# Patient Record
Sex: Male | Born: 1945 | Race: Black or African American | Hispanic: No | Marital: Married | Smoking: Never smoker
Health system: Southern US, Community
[De-identification: ages and names within clinical notes are randomized; demographics above are authoritative.]

## PROBLEM LIST (undated history)

## (undated) DIAGNOSIS — I10 Essential (primary) hypertension: Secondary | ICD-10-CM

## (undated) DIAGNOSIS — K219 Gastro-esophageal reflux disease without esophagitis: Secondary | ICD-10-CM

## (undated) DIAGNOSIS — I200 Unstable angina: Secondary | ICD-10-CM

## (undated) HISTORY — PX: HERNIA REPAIR: SHX51

## (undated) HISTORY — PX: TOTAL HIP ARTHROPLASTY: SHX124

---

## 2011-02-06 ENCOUNTER — Encounter: Payer: Self-pay | Admitting: Emergency Medicine

## 2011-02-06 ENCOUNTER — Emergency Department (HOSPITAL_COMMUNITY)

## 2011-02-06 ENCOUNTER — Other Ambulatory Visit: Payer: Self-pay

## 2011-02-06 ENCOUNTER — Inpatient Hospital Stay (HOSPITAL_COMMUNITY)
Admission: EM | Admit: 2011-02-06 | Discharge: 2011-02-09 | DRG: 287 | Disposition: A | Discharge status: Home / Self Care | Source: Ambulatory Visit | Attending: Cardiology | Admitting: Cardiology

## 2011-02-06 DIAGNOSIS — I2510 Atherosclerotic heart disease of native coronary artery without angina pectoris: Principal | ICD-10-CM | POA: Diagnosis present

## 2011-02-06 DIAGNOSIS — I200 Unstable angina: Secondary | ICD-10-CM

## 2011-02-06 DIAGNOSIS — I10 Essential (primary) hypertension: Secondary | ICD-10-CM | POA: Insufficient documentation

## 2011-02-06 DIAGNOSIS — Z8249 Family history of ischemic heart disease and other diseases of the circulatory system: Secondary | ICD-10-CM

## 2011-02-06 DIAGNOSIS — Z8711 Personal history of peptic ulcer disease: Secondary | ICD-10-CM

## 2011-02-06 DIAGNOSIS — R079 Chest pain, unspecified: Secondary | ICD-10-CM

## 2011-02-06 DIAGNOSIS — K219 Gastro-esophageal reflux disease without esophagitis: Secondary | ICD-10-CM

## 2011-02-06 DIAGNOSIS — Z96649 Presence of unspecified artificial hip joint: Secondary | ICD-10-CM

## 2011-02-06 DIAGNOSIS — Z7982 Long term (current) use of aspirin: Secondary | ICD-10-CM

## 2011-02-06 DIAGNOSIS — I2584 Coronary atherosclerosis due to calcified coronary lesion: Secondary | ICD-10-CM | POA: Diagnosis present

## 2011-02-06 HISTORY — DX: Unstable angina: I20.0

## 2011-02-06 HISTORY — DX: Gastro-esophageal reflux disease without esophagitis: K21.9

## 2011-02-06 HISTORY — DX: Essential (primary) hypertension: I10

## 2011-02-06 LAB — COMPREHENSIVE METABOLIC PANEL
AST: 24 U/L (ref 0–37)
Albumin: 3.7 g/dL (ref 3.5–5.2)
Alkaline Phosphatase: 50 U/L (ref 39–117)
Chloride: 101 mEq/L (ref 96–112)
Potassium: 3.6 mEq/L (ref 3.5–5.1)
Sodium: 137 mEq/L (ref 135–145)
Total Bilirubin: 0.6 mg/dL (ref 0.3–1.2)

## 2011-02-06 LAB — CBC
Platelets: 187 10*3/uL (ref 150–400)
RBC: 5.46 MIL/uL (ref 4.22–5.81)
WBC: 7.4 10*3/uL (ref 4.0–10.5)

## 2011-02-06 LAB — TROPONIN I: Troponin I: <0.3 ng/mL (ref <0.30)

## 2011-02-06 MED ORDER — ASPIRIN 81 MG PO CHEW
CHEWABLE_TABLET | ORAL | Status: AC
  Administered 2011-02-06: 325 mg
  Filled 2011-02-06: qty 4

## 2011-02-06 MED ORDER — ASPIRIN 325 MG PO TABS: 325.0000 mg | ORAL_TABLET | Freq: Once | ORAL | Status: DC

## 2011-02-06 MED ORDER — ENOXAPARIN SODIUM 100 MG/ML ~~LOC~~ SOLN
85.0000 mg | SUBCUTANEOUS | Status: AC
  Administered 2011-02-06: 85 mg via SUBCUTANEOUS
  Filled 2011-02-06: qty 1

## 2011-02-06 NOTE — ED Notes (Signed)
Pt states pain has decreased to 1/10. Denies sob. Wife at bedside. Awaiting lab results

## 2011-02-06 NOTE — ED Provider Notes (Signed)
History     CSN: [id number] Arrival date & time: 02/06/2011  7:15 PM   First MD Initiated Contact with Patient 02/06/11 2055      Chief Complaint  Patient presents with  . Chest Pain    Patient is a 65 y.o. male presenting with chest pain. The history is provided by the patient.  Chest Pain    Patient reports 3 days of left-sided chest pain with associated nausea.  The pain is located under the left breast that is sharp in nature.  He reports his discomfort is intermittent.  It is worsened by exertion.  It is improved by rest.  He's never had these symptoms before.  He does have a history of GI ulcers and heartburn for which he reports this is different.  His wife convinced him to come to the emergency department today.  Currently he is chest pain-free.  He has no history of hypertension or hyperlipidemia.  He is not a smoker.  He has no history of diabetes.  He has had 2 brothers both with heart by passes.  No recent long travel.  Denies unilateral leg swelling.  No history of DVT or pulmonary embolus   History reviewed. No pertinent past medical history.  Past Surgical History  Procedure Date  . Total hip arthroplasty   . Hernia repair     History reviewed. No pertinent family history.  History  Substance Use Topics  . Smoking status: Never Smoker   . Smokeless tobacco: Not on file  . Alcohol Use: Yes      Review of Systems  Cardiovascular: Positive for chest pain.  All other systems reviewed and are negative.    Allergies  Review of patient's allergies indicates no known allergies.  Home Medications   Current Outpatient Rx  Name Route Sig Dispense Refill  . OMEPRAZOLE 20 MG PO CPDR Oral Take 20 mg by mouth daily.        BP 166/92  Pulse 60  Temp(Src) 98.4 F (36.9 C) (Oral)  Resp 18  Wt 188 lb (85.276 kg)  SpO2 100%  Physical Exam  Nursing note and vitals reviewed. Constitutional: He is oriented to person, place, and time. He appears well-developed  and well-nourished.  HENT:  Head: Normocephalic and atraumatic.  Eyes: EOM are normal.  Neck: Normal range of motion.  Cardiovascular: Normal rate, regular rhythm, normal heart sounds and intact distal pulses.   Pulmonary/Chest: Effort normal and breath sounds normal. No respiratory distress.  Abdominal: Soft. He exhibits no distension. There is no tenderness.  Musculoskeletal: Normal range of motion.  Neurological: He is alert and oriented to person, place, and time.  Skin: Skin is warm and dry.  Psychiatric: He has a normal mood and affect. Judgment normal.    ED Course  Procedures (including critical care time)  Labs Reviewed  COMPREHENSIVE METABOLIC PANEL - Abnormal; Notable for the following:    Glucose, Bld 105 (*)    GFR calc non Af Amer 69 (*)    GFR calc Af Amer 80 (*)    All other components within normal limits  CBC  TROPONIN I  PROTIME-INR   Dg Chest 2 View  02/06/2011  *RADIOLOGY REPORT*  Clinical Data: 2-day history of chest discomfort and pain.  History of GE reflux disease.  CHEST - 2 VIEW 02/06/2011:  Comparison: None.  Findings: Cardiac silhouette normal in size.  Thoracic aorta mildly tortuous.  Hilar and mediastinal contours otherwise unremarkable. Numerous calcified granulomata in both lungs.  Lungs otherwise clear.  No pleural effusions.  Mild degenerative changes involving the thoracic spine.  IMPRESSION: No acute cardiopulmonary disease.  Multiple calcified granulomata throughout both lungs.  Original Report Authenticated By: Arnell Sieving, M.D.     1. Chest pain       MDM  Story concerning for unstable angina with exertional chest pain and a significant family history of coronary artery disease.  EKG and troponin are normal here I spoke with the [facility] cardiologist who will evaluate the pt in the ER. ASA and lovenox. CP free now          Lyanne Co, MD 02/06/11 2350

## 2011-02-06 NOTE — ED Notes (Signed)
Pt denies pain at this time. Medicated as listed. Awaiting md recheck for dispostion

## 2011-02-06 NOTE — ED Notes (Signed)
Pt reports onset yesterday with left sided chest pain and nausea. Pt talking in complete sentences without difficulty.

## 2011-02-07 ENCOUNTER — Encounter (HOSPITAL_COMMUNITY): Payer: Self-pay | Admitting: Cardiology

## 2011-02-07 DIAGNOSIS — R079 Chest pain, unspecified: Secondary | ICD-10-CM

## 2011-02-07 DIAGNOSIS — I200 Unstable angina: Secondary | ICD-10-CM

## 2011-02-07 DIAGNOSIS — K219 Gastro-esophageal reflux disease without esophagitis: Secondary | ICD-10-CM

## 2011-02-07 LAB — PROTIME-INR
INR: 1.14 (ref 0.00–1.49)
Prothrombin Time: 14.8 seconds (ref 11.6–15.2)

## 2011-02-07 LAB — LIPID PANEL
LDL Cholesterol: 139 mg/dL — ABNORMAL HIGH (ref 0–99)
Total CHOL/HDL Ratio: 4.3 RATIO
VLDL: 14 mg/dL (ref 0–40)

## 2011-02-07 LAB — CBC
Hemoglobin: 15 g/dL (ref 13.0–17.0)
Platelets: 182 10*3/uL (ref 150–400)
RBC: 5.54 MIL/uL (ref 4.22–5.81)

## 2011-02-07 LAB — BASIC METABOLIC PANEL
CO2: 26 mEq/L (ref 19–32)
Calcium: 9.1 mg/dL (ref 8.4–10.5)
GFR calc non Af Amer: 75 mL/min — ABNORMAL LOW (ref >90)
Glucose, Bld: 104 mg/dL — ABNORMAL HIGH (ref 70–99)
Potassium: 3.7 mEq/L (ref 3.5–5.1)
Sodium: 137 mEq/L (ref 135–145)

## 2011-02-07 LAB — HEPARIN LEVEL (UNFRACTIONATED): Heparin Unfractionated: 0.55 IU/mL (ref 0.30–0.70)

## 2011-02-07 MED ORDER — NITROGLYCERIN 0.4 MG SL SUBL: 0.4000 mg | SUBLINGUAL_TABLET | SUBLINGUAL | Status: DC | PRN

## 2011-02-07 MED ORDER — ASPIRIN EC 81 MG PO TBEC: 81.0000 mg | DELAYED_RELEASE_TABLET | Freq: Every day | ORAL | Status: DC

## 2011-02-07 MED ORDER — PANTOPRAZOLE SODIUM 40 MG PO TBEC
40.0000 mg | DELAYED_RELEASE_TABLET | Freq: Every day | ORAL | Status: DC
  Administered 2011-02-07 – 2011-02-08 (×2): 40 mg via ORAL
  Filled 2011-02-07 (×2): qty 1

## 2011-02-07 MED ORDER — SODIUM CHLORIDE 0.9 % IV SOLN: 250.0000 mL | INTRAVENOUS | Status: DC | PRN

## 2011-02-07 MED ORDER — HEPARIN SOD (PORCINE) IN D5W 100 UNIT/ML IV SOLN
1100.0000 [IU]/h | INTRAVENOUS | Status: DC
  Administered 2011-02-07: 1100 [IU]/h via INTRAVENOUS
  Filled 2011-02-07: qty 250

## 2011-02-07 MED ORDER — METOPROLOL TARTRATE 12.5 MG HALF TABLET
12.5000 mg | ORAL_TABLET | Freq: Two times a day (BID) | ORAL | Status: DC
  Administered 2011-02-07 – 2011-02-09 (×2): 12.5 mg via ORAL
  Filled 2011-02-07 (×6): qty 1

## 2011-02-07 MED ORDER — ROSUVASTATIN CALCIUM 20 MG PO TABS
20.0000 mg | ORAL_TABLET | Freq: Every day | ORAL | Status: DC
  Administered 2011-02-07 – 2011-02-08 (×2): 20 mg via ORAL
  Filled 2011-02-07 (×3): qty 1

## 2011-02-07 MED ORDER — SODIUM CHLORIDE 0.9 % IJ SOLN: 3.0000 mL | INTRAMUSCULAR | Status: DC | PRN

## 2011-02-07 MED ORDER — AMLODIPINE BESYLATE 5 MG PO TABS
5.0000 mg | ORAL_TABLET | Freq: Every day | ORAL | Status: DC
  Administered 2011-02-07 – 2011-02-09 (×3): 5 mg via ORAL
  Filled 2011-02-07 (×3): qty 1

## 2011-02-07 MED ORDER — DIAZEPAM 5 MG PO TABS
5.0000 mg | ORAL_TABLET | ORAL | Status: AC
  Administered 2011-02-08: 5 mg via ORAL
  Filled 2011-02-07: qty 1

## 2011-02-07 MED ORDER — ASPIRIN 300 MG RE SUPP
300.0000 mg | RECTAL | Status: AC
  Filled 2011-02-07: qty 1

## 2011-02-07 MED ORDER — SODIUM CHLORIDE 0.9 % IJ SOLN
3.0000 mL | Freq: Two times a day (BID) | INTRAMUSCULAR | Status: DC
  Administered 2011-02-07 – 2011-02-09 (×4): 3 mL via INTRAVENOUS

## 2011-02-07 MED ORDER — ASPIRIN EC 81 MG PO TBEC
81.0000 mg | DELAYED_RELEASE_TABLET | Freq: Every day | ORAL | Status: DC
  Administered 2011-02-09: 81 mg via ORAL
  Filled 2011-02-07: qty 1

## 2011-02-07 MED ORDER — SODIUM CHLORIDE 0.9 % IJ SOLN
3.0000 mL | Freq: Two times a day (BID) | INTRAMUSCULAR | Status: DC
  Administered 2011-02-08 – 2011-02-09 (×2): 3 mL via INTRAVENOUS

## 2011-02-07 MED ORDER — SODIUM CHLORIDE 0.9 % IV SOLN
1.0000 mL/kg/h | INTRAVENOUS | Status: DC
  Administered 2011-02-08: 1 mL/kg/h via INTRAVENOUS

## 2011-02-07 MED ORDER — ACETAMINOPHEN 325 MG PO TABS: 650.0000 mg | ORAL_TABLET | ORAL | Status: DC | PRN

## 2011-02-07 MED ORDER — HEPARIN BOLUS VIA INFUSION
4000.0000 [IU] | Freq: Once | INTRAVENOUS | Status: AC
  Administered 2011-02-07: 4000 [IU] via INTRAVENOUS
  Filled 2011-02-07: qty 4000

## 2011-02-07 MED ORDER — HEPARIN SOD (PORCINE) IN D5W 100 UNIT/ML IV SOLN
800.0000 [IU]/h | INTRAVENOUS | Status: DC
  Administered 2011-02-07 – 2011-02-08 (×2): 800 [IU]/h via INTRAVENOUS
  Filled 2011-02-07 (×3): qty 250

## 2011-02-07 MED ORDER — ASPIRIN 81 MG PO CHEW
324.0000 mg | CHEWABLE_TABLET | ORAL | Status: AC
  Administered 2011-02-08: 324 mg via ORAL
  Filled 2011-02-07: qty 4

## 2011-02-07 MED ORDER — ONDANSETRON HCL 4 MG/2ML IJ SOLN: 4.0000 mg | Freq: Four times a day (QID) | INTRAMUSCULAR | Status: DC | PRN

## 2011-02-07 NOTE — Progress Notes (Signed)
ANTICOAGULATION CONSULT NOTE - Initial Consult  Pharmacy Consult for heparin Indication: chest pain/ACS  No Known Allergies  Patient Measurements: Weight: 188 lb (85.276 kg)  Vital Signs: Temp: 97.4 F (36.3 C) (12/08 2344) Temp src: Oral (12/08 2344) BP: 149/92 mmHg (12/09 0041) Pulse Rate: 52  (12/09 0041)  Labs:  [facility] 02/06/11 2207  HGB 15.0  HCT 43.7  PLT 187  APTT --  LABPROT 14.4  INR 1.10  HEPARINUNFRC --  CREATININE 1.11  CKTOTAL --  CKMB --  TROPONINI <0.30   SCr 1.1  Medical History: History reviewed. No pertinent past medical history.  Medications:  Pending  Assessment: 65yo male c/o CP different from GERD pain, initial CE negative, to begin heparin.  Goal of Therapy:  Heparin level 0.3-0.7 units/ml   Plan:  Will give heparin bolus of 4000 units followed by gtt at 1100 units/hr and monitor heparin levels and CBC.  Colleen Can PharmD BCPS 02/07/2011,1:10 AM

## 2011-02-07 NOTE — ED Notes (Signed)
Attempted to call report to 32; RN to call back

## 2011-02-07 NOTE — Progress Notes (Signed)
@   Subjective:  Denies CP or dyspnea   Objective:  Filed Vitals:   02/06/11 2344 02/07/11 0041 02/07/11 0200 02/07/11 0309  BP: 148/91 149/92 163/92 161/94  Pulse: 50 52 46 54  Temp: 97.4 F (36.3 C)  98 F (36.7 C) 98.4 F (36.9 C)  TempSrc: Oral  Oral Oral  Resp: 20 17  18   Height:    5\' 8"  (1.727 m)  Weight:    192 lb 10.9 oz (87.4 kg)  SpO2: 98% 98% 99% 98%    Intake/Output from previous day:  Intake/Output Summary (Last 24 hours) at 02/07/11 1006 Last data filed at 02/07/11 0800  Gross per 24 hour  Intake    240 ml  Output      0 ml  Net    240 ml    Physical Exam: Physical exam: Well-developed well-nourished in no acute distress.  Skin is warm and dry.  HEENT is normal.  Neck is supple. No thyromegaly.  Chest is clear to auscultation with normal expansion.  Cardiovascular exam is regular rate and rhythm.  Abdominal exam nontender or distended. No masses palpated. Extremities show no edema. neuro grossly intact    Lab Results: Basic Metabolic Panel:  [facility] 02/06/11 2207  NA 137  K 3.6  CL 101  CO2 27  GLUCOSE 105*  BUN 11  CREATININE 1.11  CALCIUM 9.2  MG --  PHOS --   Liver Function Tests:  [facility] 02/06/11 2207  AST 24  ALT 15  ALKPHOS 50  BILITOT 0.6  PROT 7.0  ALBUMIN 3.7   CBC:  Basename 02/06/11 2207  WBC 7.4  NEUTROABS --  HGB 15.0  HCT 43.7  MCV 80.0  PLT 187   Cardiac Enzymes:  Basename 02/06/11 2207  CKTOTAL --  CKMB --  CKMBINDEX --  TROPONINI <0.30   Fasting Lipid Panel:  Basename 02/07/11 0550  CHOL 199  HDL 46  LDLCALC 139*  TRIG 69  CHOLHDL 4.3  LDLDIRECT --     Assessment/Plan:  1) UA - Patient with new onset exertional chest pain; plan cath in AM (risks and benefits discussed and patient agrees to proceed). Continue ASA, heparin and lopressor; add norvasc for BP; add statin. 2)GERD   Olga Millers 02/07/2011, 10:06 AM

## 2011-02-07 NOTE — ED Notes (Signed)
Paged MD re: pt's HR has been occasionally dropping into the 30's, but pt is asymptomatic, and pt reports he has a history of his HR running in the 40's

## 2011-02-07 NOTE — Progress Notes (Signed)
ANTICOAGULATION CONSULT NOTE - Follow Up Consult  Pharmacy Consult for Heparin Indication: Chest pain  No Known Allergies  Patient Measurements: Height: 5\' 8"  (172.7 cm) Weight: 192 lb 10.9 oz (87.4 kg) (scale C) IBW/kg (Calculated) : 68.4    Vital Signs: Temp: 97.1 F (36.2 C) (12/09 1309) Temp src: Oral (12/09 1309) BP: 129/78 mmHg (12/09 1309) Pulse Rate: 49  (12/09 1309)  Labs:  Basename 02/07/11 1938 02/07/11 1031 02/06/11 2207  HGB -- 15.0 15.0  HCT -- 44.1 43.7  PLT -- 182 187  APTT -- -- --  LABPROT -- 14.8 14.4  INR -- 1.14 1.10  HEPARINUNFRC 0.55 1.10* --  CREATININE -- 1.03 1.11  CKTOTAL -- -- --  CKMB -- -- --  TROPONINI -- -- <0.30   Estimated Creatinine Clearance: 78.9 ml/min (by C-G formula based on Cr of 1.03).   Medications:  Prescriptions prior to admission  Medication Sig Dispense Refill  . omeprazole (PRILOSEC) 20 MG capsule Take 20 mg by mouth daily.         Scheduled:     . amLODipine  5 mg Oral Daily  . aspirin      . aspirin  324 mg Oral Pre-Cath  . aspirin EC  81 mg Oral Daily  . aspirin  300 mg Rectal NOW  . aspirin  325 mg Oral Once  . diazepam  5 mg Oral On Call  . enoxaparin  85 mg Subcutaneous To Major  . heparin  4,000 Units Intravenous Once  . metoprolol tartrate  12.5 mg Oral BID  . pantoprazole  40 mg Oral Q1200  . rosuvastatin  20 mg Oral q1800  . sodium chloride  3 mL Intravenous Q12H  . sodium chloride  3 mL Intravenous Q12H  . DISCONTD: aspirin EC  81 mg Oral Daily   Infusions:     . sodium chloride    . heparin 800 Units/hr (02/07/11 1230)  . DISCONTD: heparin 1,100 Units/hr (02/07/11 1610)    Assessment: Patient with new onset exertional chest pain.  Scheduled cath in AM.  Goal of Therapy:  Heparin level 0.3-0.7 units/ml   Plan:  1.  Continue heparin at 800 units/hr 2.  F/u 8h confirmatory level  [place] L. Illene Bolus, PharmD, BCPS Clinical Pharmacist Pager: [phone] 02/07/2011 8:20 PM

## 2011-02-07 NOTE — H&P (Addendum)
Lawrence Porter is an 65 y.o. male.   Chief Complaint: Chest pain HPI: 65 y/o male with a PMH of GERD and a family history of 2 brothers with CABG at age 17 presenting with 3 day history of intermittent exertional chest discomfort.  Chest pain is described as pressure 4/10 in severity.  It is worse with exertion and relieved by rest.  He specifically states that this chest pain is different from his GERD pain.  In ED his first set of cardiac markers are negative and he is currently chest pain free.   History reviewed. No pertinent past medical history.  Past Surgical History  Procedure Date  . Total hip arthroplasty   . Hernia repair     History reviewed. No pertinent family history. Social History:  reports that he has never smoked. He does not have any smokeless tobacco history on file. He reports that he drinks alcohol. He reports that he does not use illicit drugs.  Allergies: No Known Allergies  Medications Prior to Admission  Medication Dose Route Frequency Provider Last Rate Last Dose  . aspirin 81 MG chewable tablet        325 mg at 02/06/11 2208  . aspirin tablet 325 mg  325 mg Oral Once Lyanne Co, MD      . enoxaparin (LOVENOX) injection 85 mg  85 mg Subcutaneous To Major Lyanne Co, MD   85 mg at 02/06/11 2302   No current outpatient prescriptions on file as of 02/06/2011.    Results for orders placed during the hospital encounter of 02/06/11 (from the past 48 hour(s))  CBC     Status: Normal   Collection Time   02/06/11 10:07 PM      Component Value Range Comment   WBC 7.4  4.0 - 10.5 (K/uL)    RBC 5.46  4.22 - 5.81 (MIL/uL)    Hemoglobin 15.0  13.0 - 17.0 (g/dL)    HCT 04.5  40.9 - 81.1 (%)    MCV 80.0  78.0 - 100.0 (fL)    MCH 27.5  26.0 - 34.0 (pg)    MCHC 34.3  30.0 - 36.0 (g/dL)    RDW 91.4  78.2 - 95.6 (%)    Platelets 187  150 - 400 (K/uL)   TROPONIN I     Status: Normal   Collection Time   02/06/11 10:07 PM      Component Value Range Comment   Troponin I <0.30  <0.30 (ng/mL)   COMPREHENSIVE METABOLIC PANEL     Status: Abnormal   Collection Time   02/06/11 10:07 PM      Component Value Range Comment   Sodium 137  135 - 145 (mEq/L)    Potassium 3.6  3.5 - 5.1 (mEq/L)    Chloride 101  96 - 112 (mEq/L)    CO2 27  19 - 32 (mEq/L)    Glucose, Bld 105 (*) 70 - 99 (mg/dL)    BUN 11  6 - 23 (mg/dL)    Creatinine, Ser 2.13  0.50 - 1.35 (mg/dL)    Calcium 9.2  8.4 - 10.5 (mg/dL)    Total Protein 7.0  6.0 - 8.3 (g/dL)    Albumin 3.7  3.5 - 5.2 (g/dL)    AST 24  0 - 37 (U/L)    ALT 15  0 - 53 (U/L)    Alkaline Phosphatase 50  39 - 117 (U/L)    Total Bilirubin 0.6  0.3 -  1.2 (mg/dL)    GFR calc non Af Amer 69 (*) >90 (mL/min)    GFR calc Af Amer 80 (*) >90 (mL/min)   PROTIME-INR     Status: Normal   Collection Time   02/06/11 10:07 PM      Component Value Range Comment   Prothrombin Time 14.4  11.6 - 15.2 (seconds)    INR 1.10  0.00 - 1.49     Dg Chest 2 View  02/06/2011  *RADIOLOGY REPORT*  Clinical Data: 2-day history of chest discomfort and pain.  History of GE reflux disease.  CHEST - 2 VIEW 02/06/2011:  Comparison: None.  Findings: Cardiac silhouette normal in size.  Thoracic aorta mildly tortuous.  Hilar and mediastinal contours otherwise unremarkable. Numerous calcified granulomata in both lungs.  Lungs otherwise clear.  No pleural effusions.  Mild degenerative changes involving the thoracic spine.  IMPRESSION: No acute cardiopulmonary disease.  Multiple calcified granulomata throughout both lungs.  Original Report Authenticated By: Arnell Sieving, M.D.    Review of Systems  Constitutional: Negative.   HENT: Negative.   Eyes: Negative.   Respiratory: Negative.   Cardiovascular: Positive for chest pain. Negative for palpitations, orthopnea, claudication, leg swelling and PND.  Gastrointestinal: Positive for heartburn (History of GERD). Negative for nausea, vomiting, abdominal pain, diarrhea, constipation, blood in stool and  melena.  Genitourinary: Negative.   Musculoskeletal: Negative.   Skin: Negative.   Neurological: Negative.   Endo/Heme/Allergies: Negative.   Psychiatric/Behavioral: Negative.     Blood pressure 148/91, pulse 50, temperature 97.4 F (36.3 C), temperature source Oral, resp. rate 20, weight 85.276 kg (188 lb), SpO2 98.00%. Physical Exam  Constitutional: He is oriented to person, place, and time. He appears well-developed. No distress.  HENT:  Head: Normocephalic.  Eyes: Conjunctivae and EOM are normal. Pupils are equal, round, and reactive to light.  Neck: Normal range of motion. Neck supple.  Cardiovascular: Normal rate, regular rhythm and normal heart sounds.  Exam reveals no gallop and no friction rub.   No murmur heard. Respiratory: Effort normal and breath sounds normal.  GI: Soft. Bowel sounds are normal. He exhibits no distension and no mass. There is no tenderness. There is no rebound and no guarding.  Musculoskeletal: Normal range of motion.  Neurological: He is alert and oriented to person, place, and time. He has normal reflexes.  Skin: Skin is warm. He is not diaphoretic.  Psychiatric: He has a normal mood and affect.    ECG: sinus rhythm with LAFB.  Assessment/Plan 1. Unstable angina: Patient is presenting with typical chest pain, and he has a family history of premature CAD.  I will admit the patient to telemetry, and repeat his cardiac markers to see if they trend upward.  He will probably need a cardiac catheterization to define his coronaries.  2. GERD: patient will be continued on a proton pump inhibitor.  AITSEBAOMO, JULIUS E 02/07/2011, 12:33 AM   Patient was reexamined and existing H and P reviewed.   Potassium was low and was replaced.  I discussed the case with the patient in detail.  He has a strong family history, and chest pain, some of which is typical and some atypical.  Dr. Jens Som has reviewed the case and scheduled the patient for cardiac  catheterization.  I discussed the indications, risks and benefits with the patient, and he consented to proceed with cath.  Allergies and pre admission meds reviewed.   Bonnee Quin, MD, [facility], [facility]

## 2011-02-07 NOTE — Progress Notes (Signed)
ANTICOAGULATION CONSULT NOTE - Follow Up Consult  Pharmacy Consult for Heparin Indication: Chest pain  No Known Allergies  Patient Measurements: Height: 5\' 8"  (172.7 cm) Weight: 192 lb 10.9 oz (87.4 kg) (scale C) IBW/kg (Calculated) : 68.4    Vital Signs: Temp: 98.4 F (36.9 C) (12/09 0309) Temp src: Oral (12/09 0309) BP: 161/94 mmHg (12/09 0309) Pulse Rate: 54  (12/09 0309)  Labs:  Basename 02/07/11 1031 02/06/11 2207  HGB 15.0 15.0  HCT 44.1 43.7  PLT 182 187  APTT -- --  LABPROT 14.8 14.4  INR 1.14 1.10  HEPARINUNFRC 1.10* --  CREATININE -- 1.11  CKTOTAL -- --  CKMB -- --  TROPONINI -- <0.30   Estimated Creatinine Clearance: 73.2 ml/min (by C-G formula based on Cr of 1.11).   Medications:  Prescriptions prior to admission  Medication Sig Dispense Refill  . omeprazole (PRILOSEC) 20 MG capsule Take 20 mg by mouth daily.         Scheduled:    . amLODipine  5 mg Oral Daily  . aspirin      . aspirin EC  81 mg Oral Daily  . aspirin  300 mg Rectal NOW  . aspirin  325 mg Oral Once  . enoxaparin  85 mg Subcutaneous To Major  . heparin  4,000 Units Intravenous Once  . metoprolol tartrate  12.5 mg Oral BID  . pantoprazole  40 mg Oral Q1200  . rosuvastatin  20 mg Oral q1800  . sodium chloride  3 mL Intravenous Q12H  . sodium chloride  3 mL Intravenous Q12H   Infusions:    . heparin    . DISCONTD: heparin 1,100 Units/hr (02/07/11 0454)    Assessment: Patient with new onset exertional chest pain.  Scheduled cath in AM.  Goal of Therapy:  Heparin level 0.3-0.7 units/ml   Plan:  Hold Heparin x 1 hour. Restart Heparin at 800 units/hr. Next Heparin level 6 hours after restart.  Janya Eveland, Elisha Headland, Pharm.D. 02/07/2011 11:34 AM

## 2011-02-08 ENCOUNTER — Encounter (HOSPITAL_COMMUNITY): Payer: Self-pay | Admitting: Cardiology

## 2011-02-08 ENCOUNTER — Encounter (HOSPITAL_COMMUNITY)
Admission: EM | Disposition: A | Discharge status: Home / Self Care | Payer: Self-pay | Source: Ambulatory Visit | Attending: Cardiology

## 2011-02-08 ENCOUNTER — Other Ambulatory Visit: Payer: Self-pay

## 2011-02-08 DIAGNOSIS — I2510 Atherosclerotic heart disease of native coronary artery without angina pectoris: Secondary | ICD-10-CM

## 2011-02-08 HISTORY — PX: LEFT HEART CATHETERIZATION WITH CORONARY ANGIOGRAM: SHX5451

## 2011-02-08 LAB — BASIC METABOLIC PANEL
BUN: 11 mg/dL (ref 6–23)
CO2: 23 mEq/L (ref 19–32)
Chloride: 104 mEq/L (ref 96–112)
Creatinine, Ser: 0.94 mg/dL (ref 0.50–1.35)
Glucose, Bld: 104 mg/dL — ABNORMAL HIGH (ref 70–99)

## 2011-02-08 LAB — CBC
HCT: 43.4 % (ref 39.0–52.0)
MCHC: 33.4 g/dL (ref 30.0–36.0)
MCV: 80.2 fL (ref 78.0–100.0)
RDW: 13.7 % (ref 11.5–15.5)

## 2011-02-08 LAB — POCT ACTIVATED CLOTTING TIME: Activated Clotting Time: 138 seconds

## 2011-02-08 SURGERY — LEFT HEART CATHETERIZATION WITH CORONARY ANGIOGRAM
Anesthesia: Moderate Sedation | Laterality: Right

## 2011-02-08 MED ORDER — ONDANSETRON HCL 4 MG/2ML IJ SOLN: 4.0000 mg | Freq: Four times a day (QID) | INTRAMUSCULAR | Status: DC | PRN

## 2011-02-08 MED ORDER — FENTANYL CITRATE 0.05 MG/ML IJ SOLN
INTRAMUSCULAR | Status: AC
  Filled 2011-02-08: qty 2

## 2011-02-08 MED ORDER — NITROGLYCERIN 0.2 MG/ML ON CALL CATH LAB
INTRAVENOUS | Status: AC
  Filled 2011-02-08: qty 1

## 2011-02-08 MED ORDER — POTASSIUM CHLORIDE CRYS ER 20 MEQ PO TBCR
EXTENDED_RELEASE_TABLET | ORAL | Status: AC
  Filled 2011-02-08: qty 2

## 2011-02-08 MED ORDER — MIDAZOLAM HCL 2 MG/2ML IJ SOLN
INTRAMUSCULAR | Status: AC
  Filled 2011-02-08: qty 2

## 2011-02-08 MED ORDER — LIDOCAINE HCL (PF) 1 % IJ SOLN
INTRAMUSCULAR | Status: AC
  Filled 2011-02-08: qty 30

## 2011-02-08 MED ORDER — ALUM & MAG HYDROXIDE-SIMETH 200-200-20 MG/5ML PO SUSP
ORAL | Status: AC
  Filled 2011-02-08: qty 30

## 2011-02-08 MED ORDER — HEPARIN (PORCINE) IN NACL 2-0.9 UNIT/ML-% IJ SOLN
INTRAMUSCULAR | Status: AC
  Filled 2011-02-08: qty 2000

## 2011-02-08 MED ORDER — MAGNESIUM HYDROXIDE 400 MG/5ML PO SUSP
30.0000 mL | Freq: Once | ORAL | Status: AC
  Administered 2011-02-08: 30 mL via ORAL
  Filled 2011-02-08: qty 30

## 2011-02-08 MED ORDER — SODIUM CHLORIDE 0.9 % IV SOLN: INTRAVENOUS | Status: AC

## 2011-02-08 MED ORDER — POTASSIUM CHLORIDE CRYS ER 20 MEQ PO TBCR: 40.0000 meq | EXTENDED_RELEASE_TABLET | Freq: Once | ORAL | Status: DC

## 2011-02-08 MED ORDER — ALUM & MAG HYDROXIDE-SIMETH 200-200-20 MG/5ML PO SUSP: 30.0000 mL | Freq: Once | ORAL | Status: DC

## 2011-02-08 NOTE — Progress Notes (Signed)
ANTICOAGULATION CONSULT NOTE - Follow Up Consult  Pharmacy Consult for Heparin Indication: chest pain/ACS  No Known Allergies  Patient Measurements: Height: 5\' 8"  (172.7 cm) Weight: 192 lb 10.9 oz (87.4 kg) (scale C) IBW/kg (Calculated) : 68.4  Adjusted Body Weight:   Vital Signs: Temp: 97.9 F (36.6 C) (12/10 0650) Temp src: Oral (12/09 2214) BP: 114/69 mmHg (12/10 0650) Pulse Rate: 56  (12/10 0650)  Labs:  Basename 02/08/11 0545 02/07/11 1938 02/07/11 1031 02/06/11 2207  HGB 14.5 -- 15.0 --  HCT 43.4 -- 44.1 43.7  PLT 180 -- 182 187  APTT -- -- -- --  LABPROT -- -- 14.8 14.4  INR -- -- 1.14 1.10  HEPARINUNFRC 0.41 0.55 1.10* --  CREATININE 0.94 -- 1.03 1.11  CKTOTAL -- -- -- --  CKMB -- -- -- --  TROPONINI -- -- -- <0.30   Estimated Creatinine Clearance: 86.5 ml/min (by C-G formula based on Cr of 0.94).  Assessment: 65yom admitted with chest pain. HL (0.41) within therapeutic range. No bleeding complications noted. CBC stable. Cath scheduled for today.  Goal of Therapy:  Heparin level 0.3-0.7 units/ml   Plan:  Continue heparin at 800 units/hr Follow up plan post cath  Severiano Gilbert 02/08/2011,8:28 AM

## 2011-02-08 NOTE — Op Note (Signed)
Cardiac Catheterization Procedure Note  Name: Lawrence Porter MRN: [id number] DOB: 14-May-1945  Procedure: Left Heart Cath, Selective Coronary Angiography, LV angiography  Indication: Chest pain with positive family history   Procedural details: The right groin was prepped, draped, and anesthetized with 1% lidocaine. Using modified Seldinger technique, a 4 French sheath was introduced into the right femoral artery. Standard Judkins catheters were used for coronary angiography and left ventriculography. Catheter exchanges were performed over a guidewire. There were no immediate procedural complications. The patient was transferred to the post catheterization recovery area for further monitoring.  Procedural Findings: Hemodynamics:  AO 114/64 (84) LV 111/6s   Coronary angiography: Coronary dominance: right  Left mainstem: The left main was free of disease  Left anterior descending (LAD): The LAD was calcified in the mid portion.  There was mild irregularity of the proximal portions of all three diagonal branches.  After the third diagonal, there is a long area of segmental plaque with about 60-70% diffuse narrowing.  The apical LAD wraps the apex.  The length of narrowing is about to 20-46mm in length.    Left circumflex (LCx): The vessel has one large marginal and a smaller PL branch.  There is not high grade narrowing.  Right coronary artery (RCA): Large ectatic vessel with sluggish contrast runoff, possibly due to vessel size.  Distally, there is up to 50-60% narrowing distally that is eccentric, best seen in the RAO view.  The PDA is large, then tapers where it bifurcates distally.  Left ventriculography: Left ventricular systolic function is normal, LVEF is estimated at 55-65%, there is no significant mitral regurgitation (only diastolic).  Final Conclusions:   1.  Preserved LV function without wall motion abnormality 2.  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   Recommendations: I have reviewed the films with the patient, his wife, and called Dr. Jens Som.  The relationship of symptoms is unclear.  Would recommend initial medical therapy with early consideration of exercise testing with imaging to assess focal ischemia.  The distal LAD would require a long stent, and the RCA is quite large.    Shawnie Pons 02/08/2011 9:03 AM   Shawnie Pons 02/08/2011, 8:42 AM

## 2011-02-08 NOTE — Progress Notes (Signed)
Pt is ambulating well with no pain, incisional dressing is clean and dry.  Dressing removed and bandaid is now in place.

## 2011-02-09 ENCOUNTER — Encounter (HOSPITAL_COMMUNITY): Payer: Self-pay | Admitting: Nurse Practitioner

## 2011-02-09 DIAGNOSIS — I10 Essential (primary) hypertension: Secondary | ICD-10-CM | POA: Insufficient documentation

## 2011-02-09 DIAGNOSIS — I369 Nonrheumatic tricuspid valve disorder, unspecified: Secondary | ICD-10-CM

## 2011-02-09 MED ORDER — NITROGLYCERIN 0.4 MG SL SUBL: 0.4000 mg | SUBLINGUAL_TABLET | SUBLINGUAL | Status: AC | PRN

## 2011-02-09 MED ORDER — AMLODIPINE BESYLATE 5 MG PO TABS: 5.0000 mg | ORAL_TABLET | Freq: Every day | ORAL | Status: AC

## 2011-02-09 MED ORDER — ASPIRIN 81 MG PO TBEC: 81.0000 mg | DELAYED_RELEASE_TABLET | Freq: Every day | ORAL | Status: AC

## 2011-02-09 MED ORDER — METOPROLOL SUCCINATE ER 50 MG PO TB24: 50.0000 mg | ORAL_TABLET | Freq: Every day | ORAL | Status: AC

## 2011-02-09 MED ORDER — ATORVASTATIN CALCIUM 40 MG PO TABS: 40.0000 mg | ORAL_TABLET | Freq: Every day | ORAL | Status: AC

## 2011-02-09 NOTE — Progress Notes (Signed)
Pt given dc instructions. Pt dc home via wc.  

## 2011-02-09 NOTE — Discharge Summary (Signed)
See progress note.

## 2011-02-09 NOTE — Progress Notes (Signed)
@   Subjective:  Denies CP or dyspnea   Objective:  Filed Vitals:   02/08/11 1506 02/08/11 2058 02/09/11 0500 02/09/11 0554  BP: 120/69 108/64  119/70  Pulse: 55 51  57  Temp: 98.3 F (36.8 C) 98.1 F (36.7 C)  98.2 F (36.8 C)  TempSrc: Oral Oral    Resp: 19 18  20   Height:      Weight:   193 lb 9 oz (87.8 kg) 193 lb 9 oz (87.8 kg)  SpO2: 95% 98%  98%    Intake/Output from previous day:  Intake/Output Summary (Last 24 hours) at 02/09/11 0658 Last data filed at 02/09/11 0557  Gross per 24 hour  Intake    606 ml  Output   2025 ml  Net  -1419 ml    Physical Exam: Physical exam: Well-developed well-nourished in no acute distress.  Skin is warm and dry.  HEENT is normal.  Neck is supple. No thyromegaly.  Chest is clear to auscultation with normal expansion.  Cardiovascular exam is regular rate and rhythm.  Abdominal exam nontender or distended. No masses palpated. Right groin with no hematoma and no bruit Extremities show no edema. neuro grossly intact    Lab Results: Basic Metabolic Panel:  Basename 02/08/11 0545 02/07/11 1031  NA 135 137  K 3.3* 3.7  CL 104 103  CO2 23 26  GLUCOSE 104* 104*  BUN 11 9  CREATININE 0.94 1.03  CALCIUM 8.6 9.1  MG -- --  PHOS -- --   Liver Function Tests:  [facility] 02/06/11 2207  AST 24  ALT 15  ALKPHOS 50  BILITOT 0.6  PROT 7.0  ALBUMIN 3.7   CBC:  Basename 02/08/11 0545 02/07/11 1031  WBC 6.2 6.4  NEUTROABS -- --  HGB 14.5 15.0  HCT 43.4 44.1  MCV 80.2 79.6  PLT 180 182   Cardiac Enzymes:  Basename 02/06/11 2207  CKTOTAL --  CKMB --  CKMBINDEX --  TROPONINI <0.30   Fasting Lipid Panel:  Basename 02/07/11 0550  CHOL 199  HDL 46  LDLCALC 139*  TRIG 69  CHOLHDL 4.3  LDLDIRECT --     Assessment/Plan:  1) UA - Cath results noted; plan medical therapy. Continue ASA and change lopressor to toprol 25 mg daily at DC; continue norvasc for BP; continue crestor; check lipids and liver in six weeks with  goal LDL of < 70. Plan myoview if he has recurrent chest pain. Fu with me in 4-6 weeks. 2)GERD >30  Min PA and physician time D2  Olga Millers 02/09/2011, 6:58 AM

## 2011-02-09 NOTE — Discharge Summary (Signed)
Patient ID: SYE SCHROEPFER,  MRN: [id number], DOB/AGE: [birth year] 63 y.o.  Admit date: 02/06/2011 Discharge date: 02/09/2011  Primary Care Provider: None Primary Cardiologist: Olga Millers  Discharge Diagnoses Principal Problem:  *Unstable angina pectoris Active Problems:  GERD (gastroesophageal reflux disease)  Hypertension   Allergies No Known Allergies  Procedures  Left Heart Cardiac Catheterization - 02/08/2011  Coronary angiography:  Coronary dominance: right  Left mainstem: The left main was free of disease  Left anterior descending (LAD): The LAD was calcified in the mid portion. There was mild irregularity of the proximal portions of all three diagonal branches. After the third diagonal, there is a long area of segmental plaque with about 60-70% diffuse narrowing. The apical LAD wraps the apex. The length of narrowing is about to 20-73mm in length.  Left circumflex (LCx): The vessel has one large marginal and a smaller PL branch. There is not high grade narrowing.  Right coronary artery (RCA): Large ectatic vessel with sluggish contrast runoff, possibly due to vessel size. Distally, there is up to 50-60% narrowing distally that is eccentric, best seen in the RAO view. The PDA is large, then tapers where it bifurcates distally.  Left ventriculography: Left ventricular systolic function is normal, LVEF is estimated at 55-65%, there is no significant mitral regurgitation (only diastolic).  Final Conclusions:  1. Preserved LV function without wall motion abnormality  2. 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  Recommendations: Would recommend initial medical therapy with early consideration of exercise testing with imaging to assess focal ischemia. The distal LAD would require a long stent, and the RCA is quite large.   History of Present Illness  65 y/o male without prior cardiac  history who presented to the [facility] ED on 02/07/2011 with a 3 day history intermittent exertional chest pain.  In the ED, his cardiac markers were negative and EKG was non-acute.  He was admitted for further evaluation.  Hospital Course    Patient ruled out for MI.  He had no further chest pain.  Because of a significant family history of CAD @ an early age, along with exertional symptoms concerning for angina, decision was made to perform diagnostic coronary angiography.  This was carried out on 02/08/2011 and revealed distal LAD and RCA, which was felt to be non-obstructive.  It was felt that patient would benefit from a trial of medical therapy and if he had recurrent symptoms, myoview stress testing would be appropriate to evaluate for focal ischemia and further guide management.  Post-cath, patient has had no further chest discomfort and he will be discharged today in good condition.  Discharge Vitals:  Blood pressure 114/64, pulse 64, temperature 98.2 F (36.8 C), temperature source Oral, resp. rate 20, height 5\' 8"  (1.727 m), weight 193 lb 9 oz (87.8 kg), SpO2 98.00%.   Labs: CBC:  Basename 02/08/11 0545 02/07/11 1031  WBC 6.2 6.4  NEUTROABS -- --  HGB 14.5 15.0  HCT 43.4 44.1  MCV 80.2 79.6  PLT 180 182   Basic Metabolic Panel:  Basename 02/08/11 0545 02/07/11 1031  NA 135 137  K 3.3* 3.7  CL 104 103  CO2 23 26  GLUCOSE 104* 104*  BUN 11 9  CREATININE 0.94 1.03  CALCIUM 8.6 9.1  MG -- --  PHOS -- --   Liver Function Tests:  [facility] 02/06/11 2207  AST 24  ALT 15  ALKPHOS 50  BILITOT 0.6  PROT 7.0  ALBUMIN 3.7   Cardiac Enzymes:  Basename 02/06/11 2207  CKTOTAL --  CKMB --  CKMBINDEX --  TROPONINI <0.30  Fasting Lipid Panel:  Basename 02/07/11 0550  CHOL 199  HDL 46  LDLCALC 139*  TRIG 69  CHOLHDL 4.3  LDLDIRECT --   Disposition:   Discharge to home today in good condition.   Follow-up Information    Follow up with Olga Millers, MD on  03/29/2011. (10:00)    Contact information:   [address] [place], [address] [place] [address] [phone]          Discharge Medications: Current Discharge Medication List    START taking these medications   Details  amLODipine (NORVASC) 5 MG tablet Take 1 tablet (5 mg total) by mouth daily. Qty: 30 tablet, Refills: 6    aspirin EC 81 MG EC tablet Take 1 tablet (81 mg total) by mouth daily.    atorvastatin (LIPITOR) 40 MG tablet Take 1 tablet (40 mg total) by mouth daily. Qty: 30 tablet, Refills: 6    metoprolol (TOPROL XL) 50 MG 24 hr tablet Take 1 tablet (50 mg total) by mouth daily. Qty: 30 tablet, Refills: 6    nitroGLYCERIN (NITROSTAT) 0.4 MG SL tablet Place 1 tablet (0.4 mg total) under the tongue every 5 (five) minutes as needed for chest pain. Qty: 25 tablet, Refills: 3      CONTINUE these medications which have NOT CHANGED   Details  omeprazole (PRILOSEC) 20 MG capsule Take 20 mg by mouth daily.          Outstanding Labs/Studies  Follow-up lipids and LFT's in 6 wks.  Duration of Discharge Encounter: Greater than 30 minutes including physician time.  Signed, Nicolasa Ducking NP 02/09/2011, 9:55 AM

## 2011-02-09 NOTE — Progress Notes (Signed)
  Echocardiogram 2D Echocardiogram has been performed.  Lawrence Porter 02/09/2011, 9:16 AM

## 2011-02-16 ENCOUNTER — Telehealth: Payer: Self-pay | Admitting: Cardiology

## 2011-03-29 ENCOUNTER — Encounter: Admitting: Cardiology

## 2011-03-29 ENCOUNTER — Other Ambulatory Visit: Admitting: *Deleted

## 2011-03-29 NOTE — Progress Notes (Signed)
   HPI: Pleasant male admitted to [facility] in December of 2012 with chest pain. Enzymes negative. Cardiac catheterization in December of 2012 revealed normal left main. There was a 60-70% LAD lesion after the third diagonal. The left circumflex was normal. The right coronary artery had a 50-60% lesion distally. LV function was normal and medical therapy recommended. Since discharge,    Current Outpatient Prescriptions  Medication Sig Dispense Refill  . amLODipine (NORVASC) 5 MG tablet Take 1 tablet (5 mg total) by mouth daily.  30 tablet  6  . aspirin EC 81 MG EC tablet Take 1 tablet (81 mg total) by mouth daily.      Marland Kitchen atorvastatin (LIPITOR) 40 MG tablet Take 1 tablet (40 mg total) by mouth daily.  30 tablet  6  . metoprolol (TOPROL XL) 50 MG 24 hr tablet Take 1 tablet (50 mg total) by mouth daily.  30 tablet  6  . nitroGLYCERIN (NITROSTAT) 0.4 MG SL tablet Place 1 tablet (0.4 mg total) under the tongue every 5 (five) minutes as needed for chest pain.  25 tablet  3  . omeprazole (PRILOSEC) 20 MG capsule Take 20 mg by mouth daily.           Past Medical History  Diagnosis Date  . Unstable angina     02/07/2011: Cath - LAD - 60-70% distal, RCA - 50-60% distal, EF 55-65%  . GERD (gastroesophageal reflux disease)   . Hypertension     Past Surgical History  Procedure Date  . Total hip arthroplasty   . Hernia repair     History   Social History  . Marital Status: Married    Spouse Name: N/A    Number of Children: N/A  . Years of Education: N/A   Occupational History  . Not on file.   Social History Main Topics  . Smoking status: Never Smoker   . Smokeless tobacco: Not on file  . Alcohol Use: Yes  . Drug Use: No  . Sexually Active:    Other Topics Concern  . Not on file   Social History Narrative  . No narrative on file    ROS: no fevers or chills, productive cough, hemoptysis, dysphasia, odynophagia, melena, hematochezia, dysuria, hematuria, rash, seizure  activity, orthopnea, PND, pedal edema, claudication. Remaining systems are negative.  Physical Exam: Well-developed well-nourished in no acute distress.  Skin is warm and dry.  HEENT is normal.  Neck is supple. No thyromegaly.  Chest is clear to auscultation with normal expansion.  Cardiovascular exam is regular rate and rhythm.  Abdominal exam nontender or distended. No masses palpated. Extremities show no edema. neuro grossly intact  ECG     This encounter was created in error - please disregard.

## 2011-05-11 ENCOUNTER — Encounter: Payer: Self-pay | Admitting: Cardiology

## 2013-03-16 NOTE — Telephone Encounter (Signed)
No other info °

## 2013-08-30 IMAGING — CR DG CHEST 2V
2 series · 2 of 2 positions shown · non-contrast
Comparison: None.

CLINICAL DATA: 2-day history of chest discomfort and pain.  History
of GE reflux disease.

CHEST - 2 VIEW 02/06/2011:

[w chest pa]
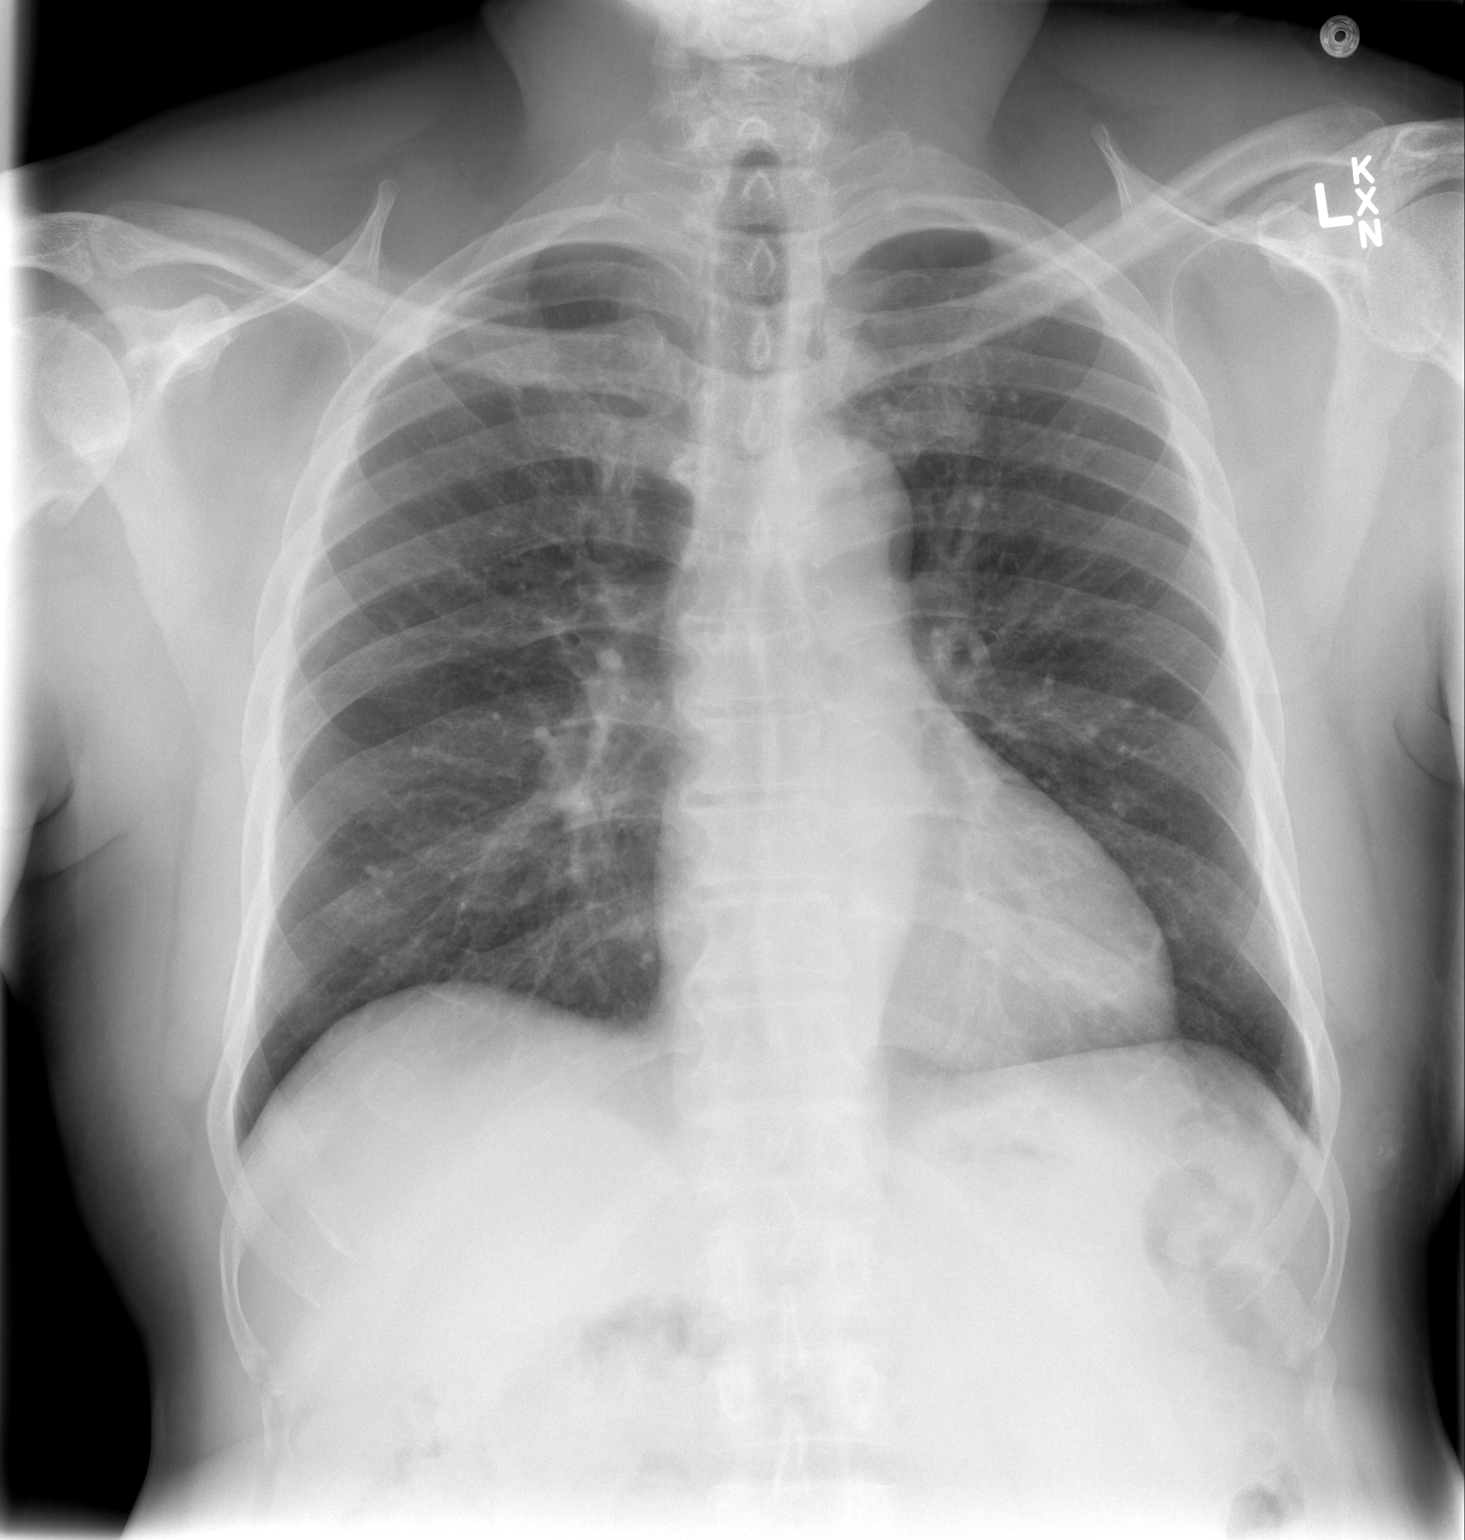

[w chest lat]
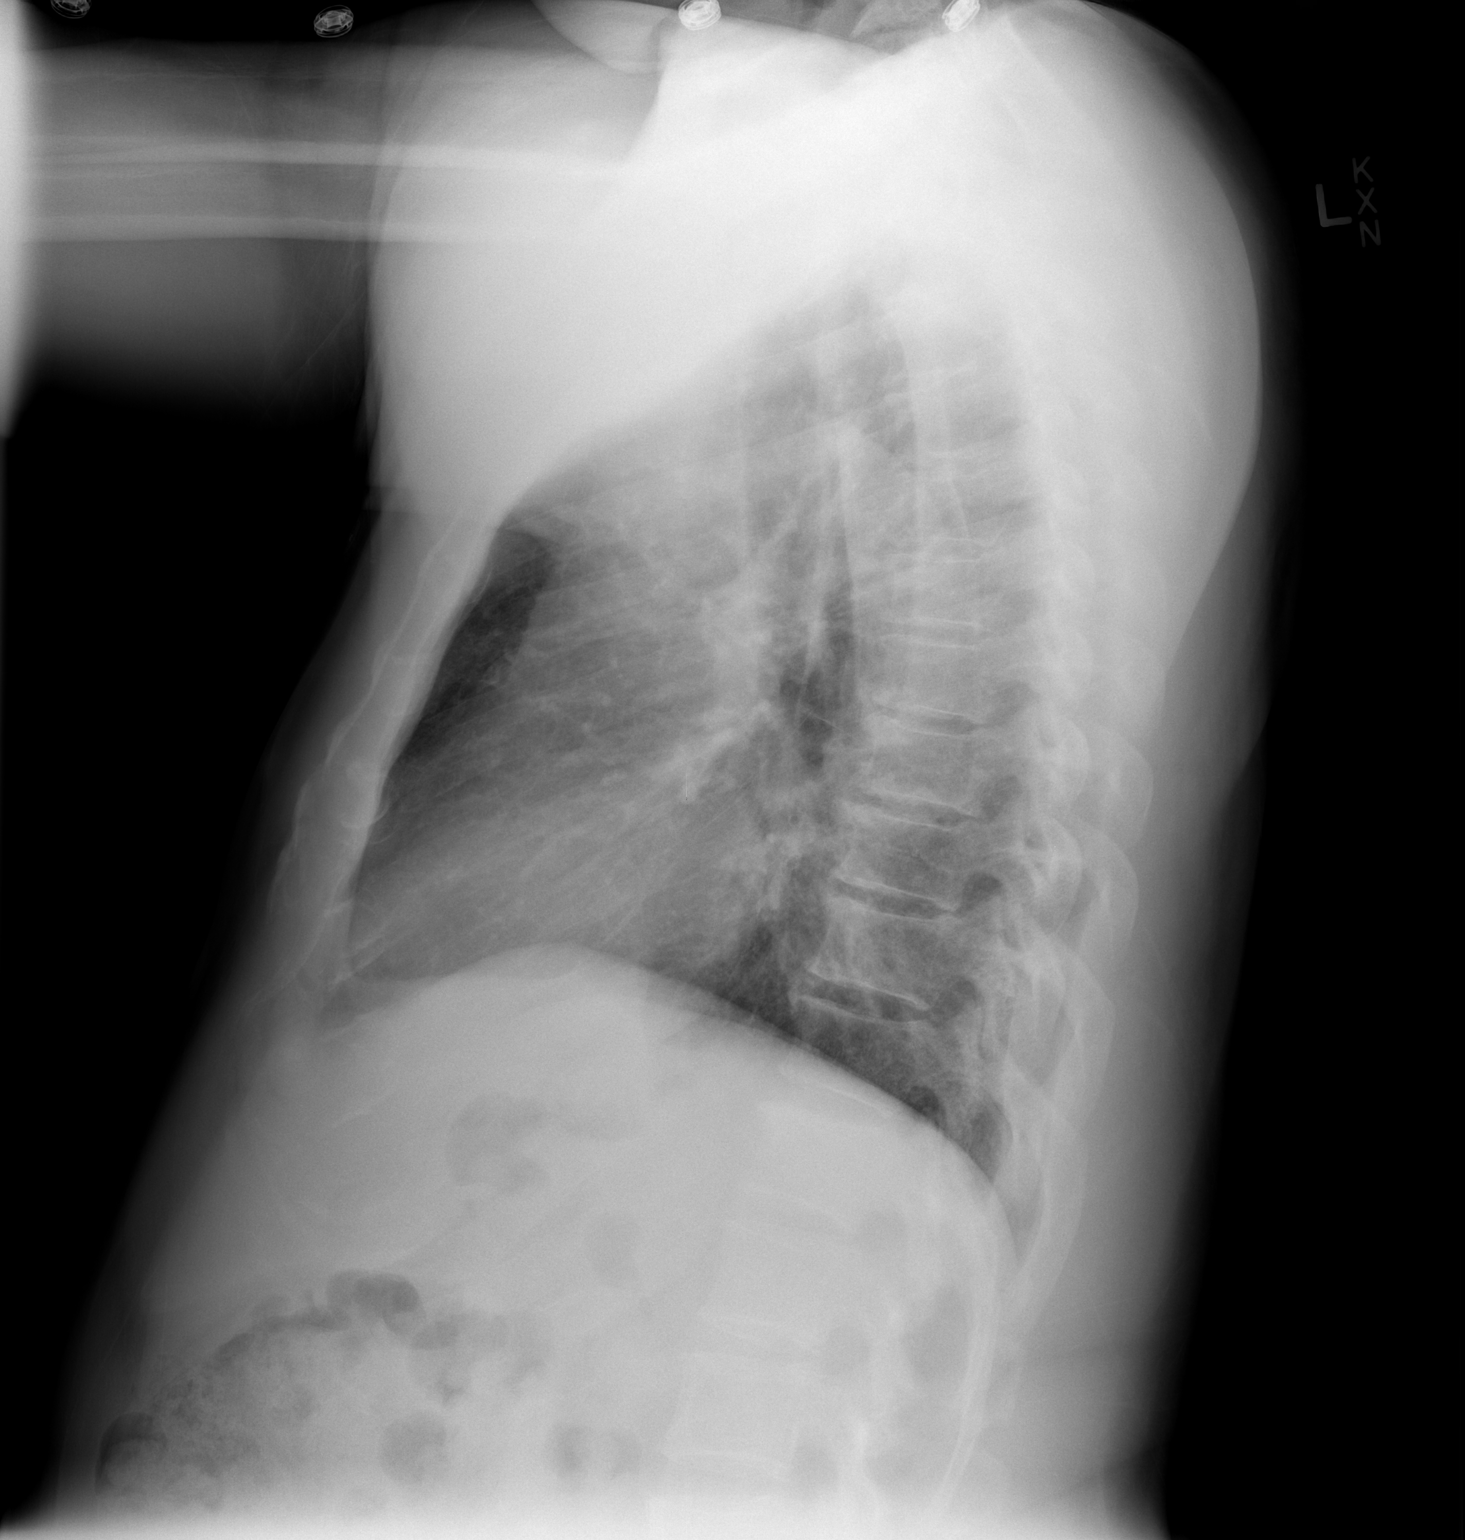

[2 of 2 positions shown; findings below may reference images not displayed]

FINDINGS: Cardiac silhouette normal in size.  Thoracic aorta mildly
tortuous.  Hilar and mediastinal contours otherwise unremarkable.
Numerous calcified granulomata in both lungs.  Lungs otherwise
clear.  No pleural effusions.  Mild degenerative changes involving
the thoracic spine.
IMPRESSION: No acute cardiopulmonary disease.  Multiple calcified granulomata
throughout both lungs.

## 2014-02-06 ENCOUNTER — Encounter (HOSPITAL_COMMUNITY): Payer: Self-pay | Admitting: Cardiology

## 2018-07-31 DEATH — deceased
